# Patient Record
Sex: Male | Born: 1995 | Hispanic: No | Marital: Single | Smoking: Never smoker
Health system: Southern US, Community
[De-identification: ages and names within clinical notes are randomized; demographics above are authoritative.]

## PROBLEM LIST (undated history)

## (undated) DIAGNOSIS — Z9109 Other allergy status, other than to drugs and biological substances: Secondary | ICD-10-CM

---
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
## 2014-03-09 IMAGING — CR DG CHEST 2V
2 series · 2 of 2 positions shown · non-contrast
Comparison: None

CLINICAL DATA: Positive PPD

CHEST - 2 VIEW

[w chest pa]
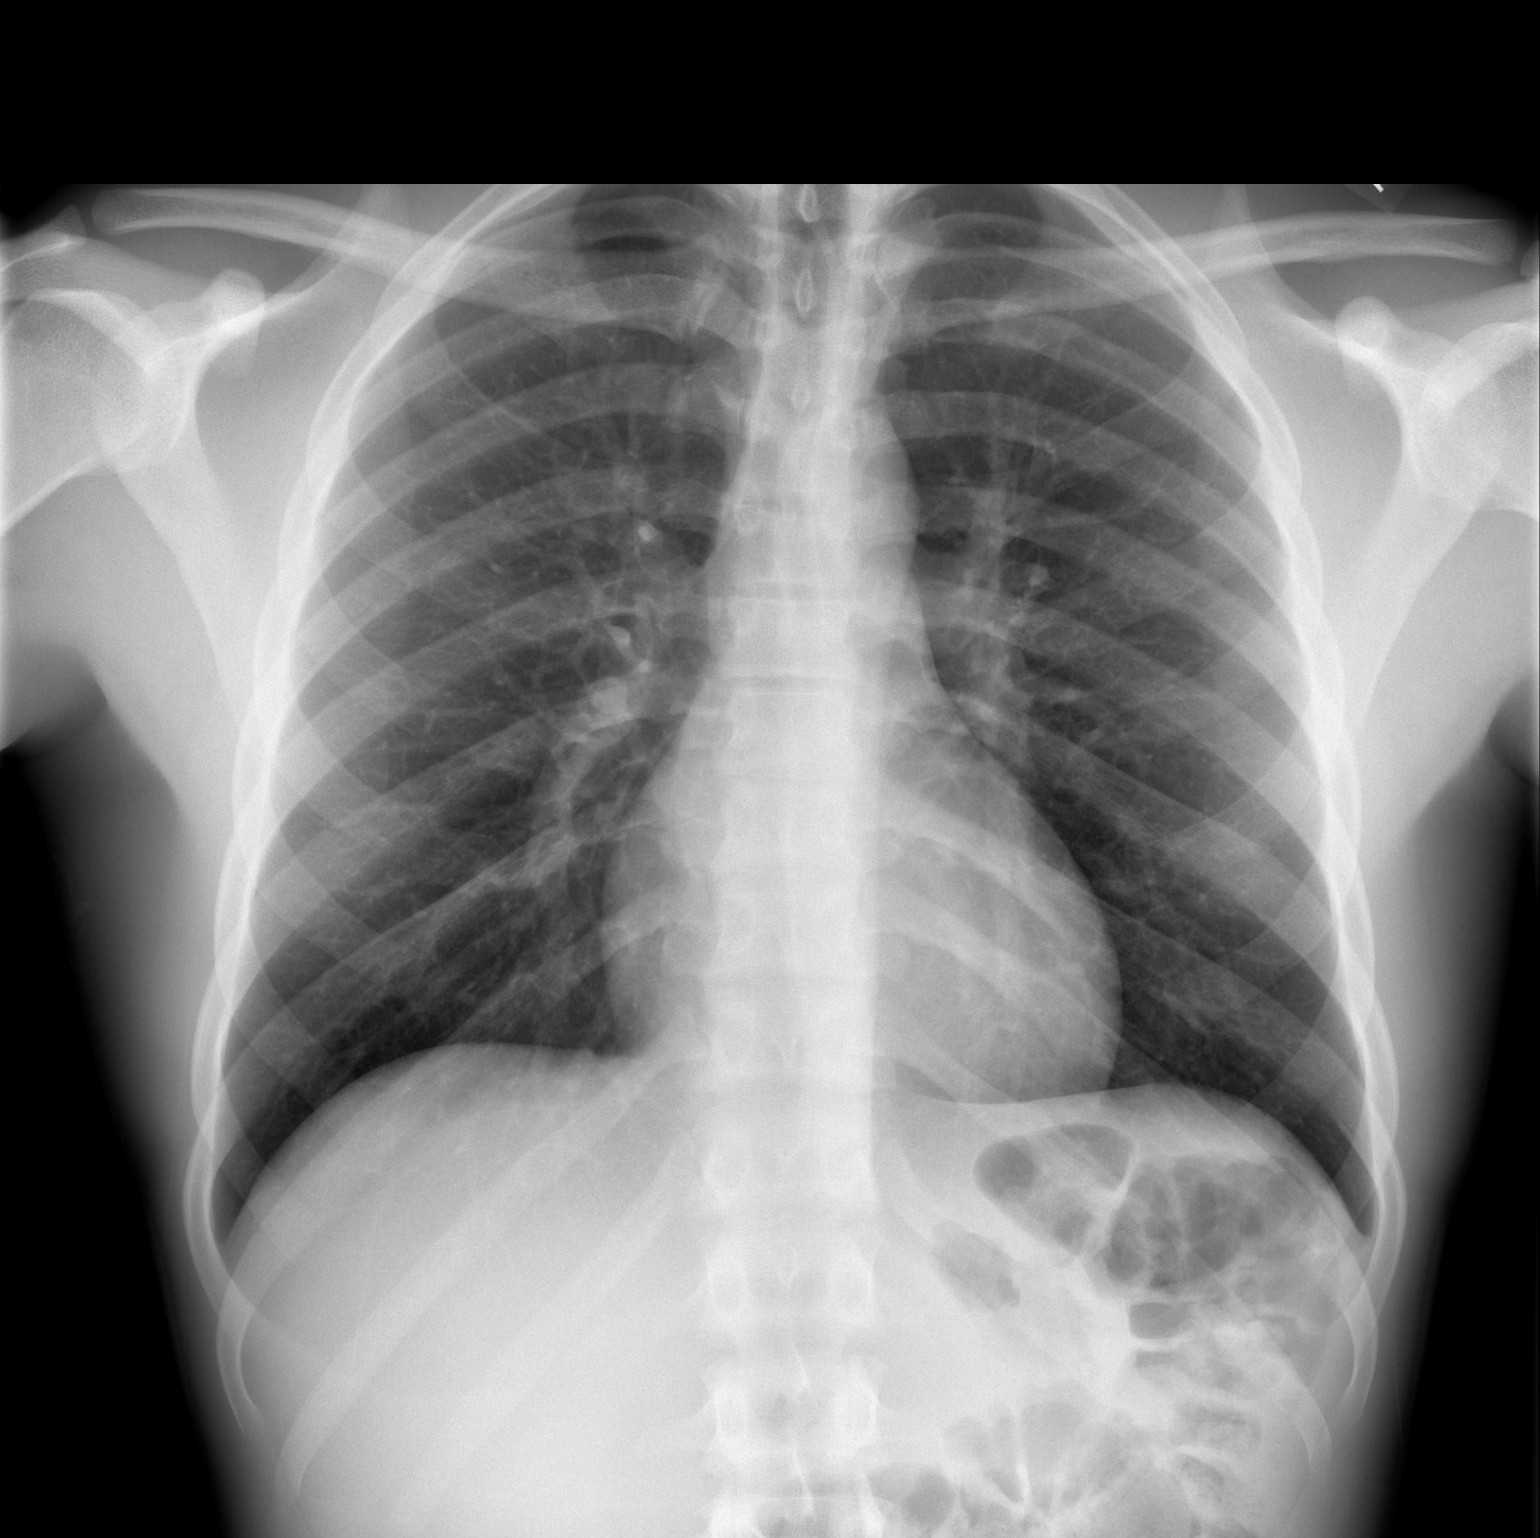

[w chest lat]
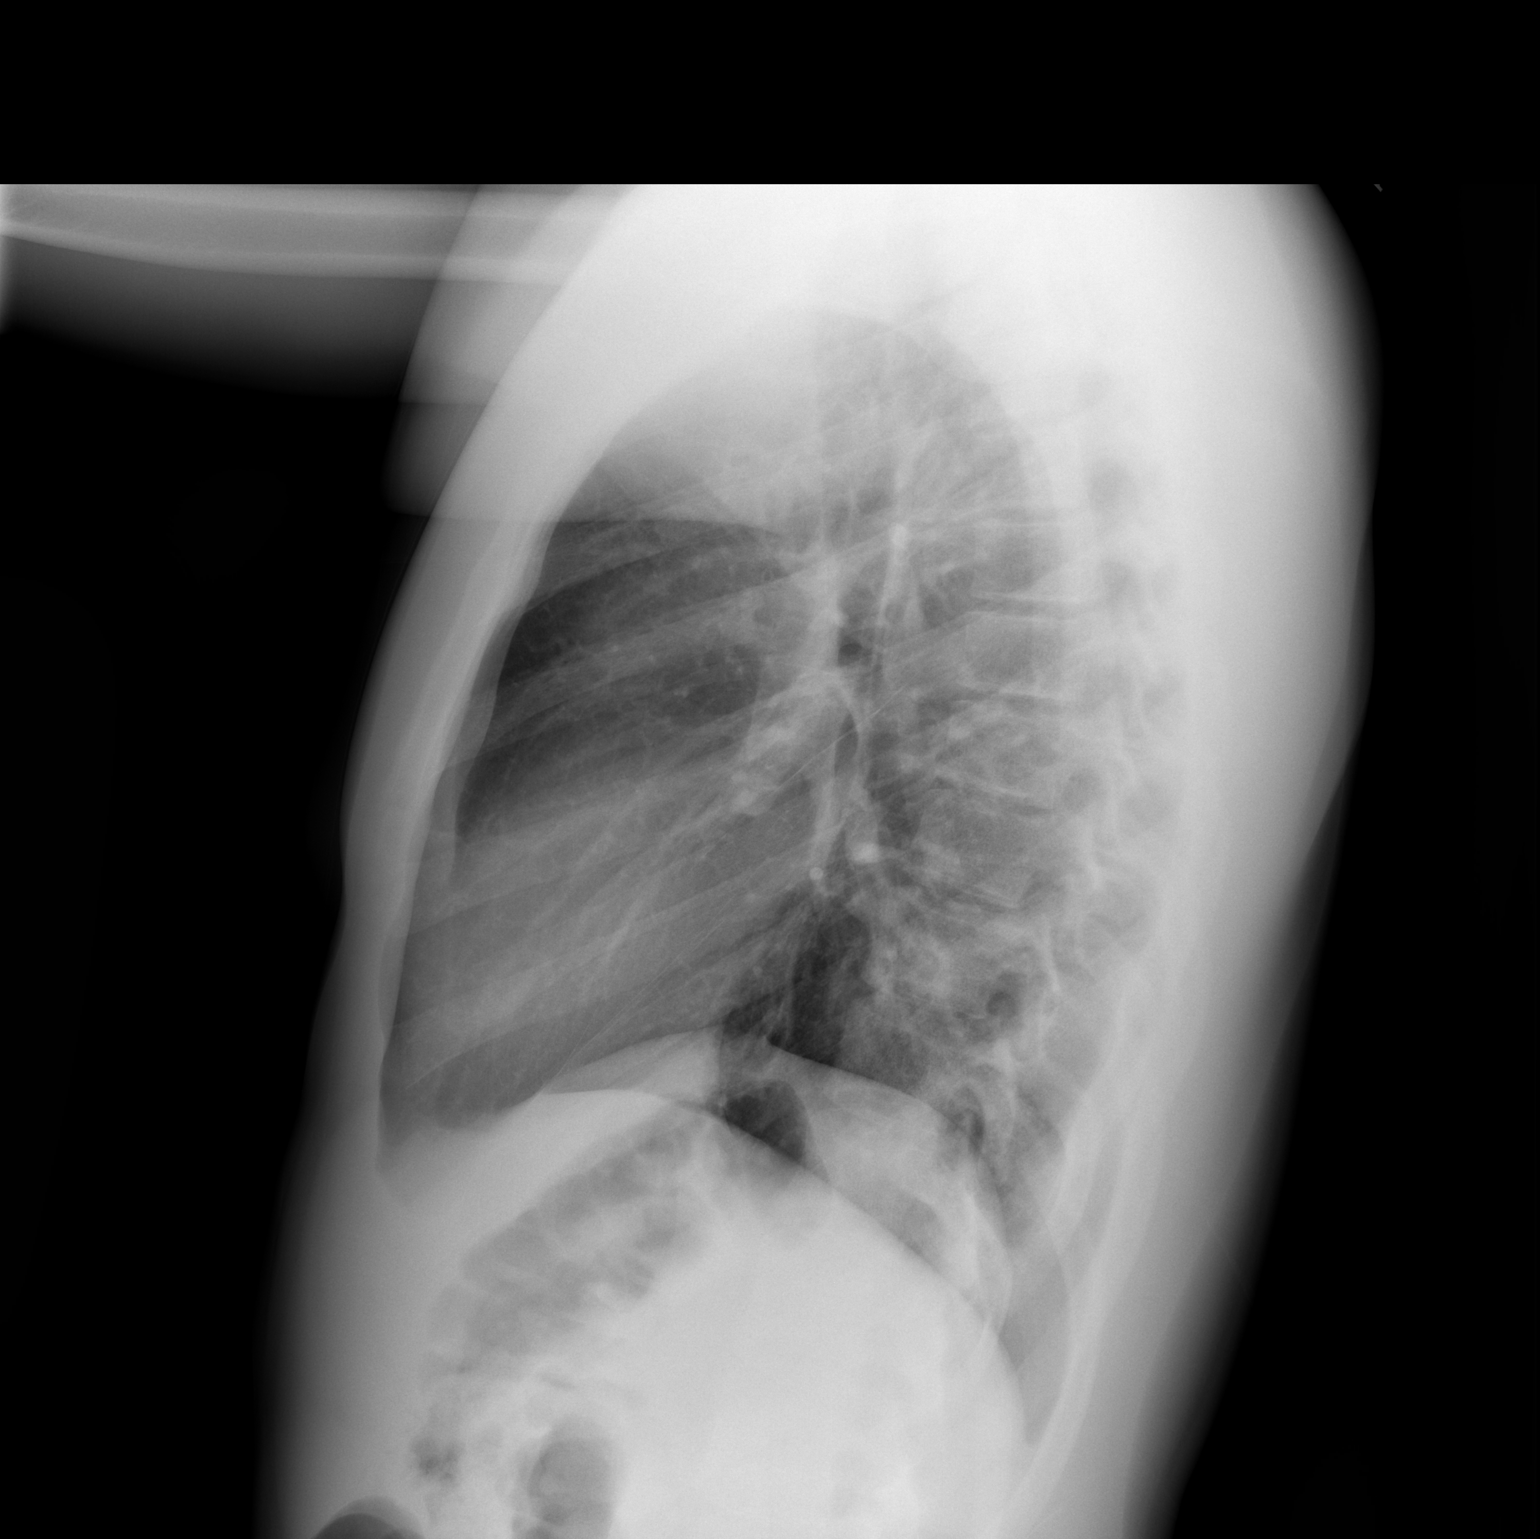

[2 of 2 positions shown; findings below may reference images not displayed]

FINDINGS: Lung volume is normal.  Lungs are clear without
infiltrate effusion or mass.  The heart size is normal and the
vascularity is normal. Negative for acute or chronic TB.
IMPRESSION: Negative

## 2018-12-21 ENCOUNTER — Emergency Department (HOSPITAL_COMMUNITY)
Admission: EM | Admit: 2018-12-21 | Discharge: 2018-12-21 | Disposition: A | Discharge status: Home / Self Care | Payer: BC Managed Care – PPO | Attending: Emergency Medicine | Admitting: Emergency Medicine

## 2018-12-21 ENCOUNTER — Other Ambulatory Visit: Payer: Self-pay

## 2018-12-21 ENCOUNTER — Encounter (HOSPITAL_COMMUNITY): Payer: Self-pay | Admitting: Emergency Medicine

## 2018-12-21 DIAGNOSIS — L0291 Cutaneous abscess, unspecified: Secondary | ICD-10-CM

## 2018-12-21 DIAGNOSIS — R519 Headache, unspecified: Secondary | ICD-10-CM | POA: Diagnosis present

## 2018-12-21 DIAGNOSIS — L02811 Cutaneous abscess of head [any part, except face]: Secondary | ICD-10-CM | POA: Insufficient documentation

## 2018-12-21 MED ORDER — ACETAMINOPHEN 500 MG PO TABS
1000.0000 mg | ORAL_TABLET | Freq: Once | ORAL | Status: AC
  Administered 2018-12-21: 1000 mg via ORAL
  Filled 2018-12-21: qty 2

## 2018-12-21 MED ORDER — BACITRACIN ZINC 500 UNIT/GM EX OINT
TOPICAL_OINTMENT | Freq: Two times a day (BID) | CUTANEOUS | Status: DC
  Administered 2018-12-21: 1 via TOPICAL
  Filled 2018-12-21: qty 0.9

## 2018-12-21 MED ORDER — LIDOCAINE-EPINEPHRINE 2 %-1:100000 IJ SOLN
20.0000 mL | Freq: Once | INTRAMUSCULAR | Status: AC
  Administered 2018-12-21: 10:00:00 20 mL via INTRADERMAL
  Filled 2018-12-21: qty 1

## 2018-12-21 MED ORDER — BUTALBITAL-APAP-CAFFEINE 50-325-40 MG PO TABS: ORAL_TABLET | ORAL | 0 refills | Status: AC

## 2018-12-21 NOTE — ED Triage Notes (Signed)
Pt c/o headaches that are intermittent over past month. For past 2-3 weeks noticed a mass on left side of head.

## 2018-12-21 NOTE — ED Provider Notes (Signed)
[facility] DEPT Provider Note   CSN: [id number] Arrival date & time: 12/21/18  0741     History   Chief Complaint Chief Complaint  Patient presents with  . Headache  . mass on head    HPI Andrew Holt is a 23 y.o. male.     HPI   Patient is a 23 year old male who presents emergency department today for evaluation of multiple complaints.  He is complaining of headaches and a swollen area to the left side of his head.  Headaches: Reports throbbing headaches located diffusely to his head.  They usually start gradually.  They have been ongoing for a month.  He does not currently have any symptoms.  States symptoms occur after studying for long hours at a time and after eating.  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  Last had his eyes checked 2 months ago and vision was normal.  Denies associated visual changes, vomiting, photosensitivity, numbness/weakness, dizziness, lightheadedness or other symptoms.  His symptoms unresolved with over-the-counter pain medications.  Denies history of same.  Does state that he grinds his teeth for multiple hours a day when he is awake and when sleeping.  Swollen area to head: He states that there has been a small area of swelling to the left side of his head.  Is been present for the last several weeks.  It is somewhat painful.  He thinks that it developed after wearing a mask for 1 of his lab classes.  He has had no fevers.  He has had minimal drainage from this area.  Past Medical History:  Diagnosis Date  . Environmental allergies     There are no active problems to display for this patient.   History reviewed. No pertinent surgical history.      Home Medications    Prior to Admission medications   Medication Sig Start Date End Date Taking? Authorizing Provider  butalbital-acetaminophen-caffeine (FIORICET) 50-325-40 MG tablet Take 1-2 tablets by mouth every 6 hours as needed for  headache. Do not take more than 6 tablets per day. 12/21/18   Sachi Boulay S, PA-C  HYDROcodone-acetaminophen (NORCO/VICODIN) 5-325 MG per tablet 1 to 2 tabs every 4 to 6 hours as needed for pain. Patient not taking: Reported on 12/21/2018 03/03/12   Harden Mo, MD    Family History No family history on file.  Social History Social History   Tobacco Use  . Smoking status: Never Smoker  . Smokeless tobacco: Never Used  Substance Use Topics  . Alcohol use: No  . Drug use: No     Allergies   Patient has no known allergies.   Review of Systems Review of Systems  Constitutional: Negative for fever.  HENT: Negative for ear pain and sore throat.   Eyes: Negative for photophobia and visual disturbance.  Respiratory: Negative for cough and shortness of breath.   Cardiovascular: Negative for chest pain.  Gastrointestinal: Negative for abdominal pain, nausea and vomiting.  Genitourinary: Negative for hematuria.  Musculoskeletal: Negative for back pain.  Skin:       abscess  Neurological: Positive for headaches (resolved). Negative for dizziness, syncope, weakness, light-headedness and numbness.  All other systems reviewed and are negative.    Physical Exam Updated Vital Signs BP 124/65 (BP Location: Left Arm)   Pulse (!) 52   Temp 98.5 F (36.9 C) (Oral)   Resp 18   SpO2 100%   Physical Exam Vitals signs and  nursing note reviewed.  Constitutional:      Appearance: He is well-developed.  HENT:     Head: Normocephalic and atraumatic.     Comments: 1.5 cm area of fluctuance to the left side of the head without surrounding induration or erythema Eyes:     Conjunctiva/sclera: Conjunctivae normal.  Neck:     Musculoskeletal: Neck supple.  Cardiovascular:     Rate and Rhythm: Normal rate and regular rhythm.     Heart sounds: Normal heart sounds. No murmur.  Pulmonary:     Effort: Pulmonary effort is normal. No respiratory distress.     Breath sounds: Normal  breath sounds. No wheezing, rhonchi or rales.  Abdominal:     General: Bowel sounds are normal.     Palpations: Abdomen is soft.     Tenderness: There is no abdominal tenderness.  Skin:    General: Skin is warm and dry.  Neurological:     Mental Status: He is alert.     Comments: Mental Status:  Alert, thought content appropriate, able to give a coherent history. Speech fluent without evidence of aphasia. Able to follow 2 step commands without difficulty.  Cranial Nerves:  II:  Pupils equal, round, reactive to light III,IV, VI: ptosis not present, extra-ocular motions intact bilaterally  V,VII: smile symmetric, facial light touch sensation equal VIII: hearing grossly normal to voice  X: uvula elevates symmetrically  XI: bilateral shoulder shrug symmetric and strong XII: midline tongue extension without fassiculations Motor:  Normal tone. 5/5 strength of BUE and BLE major muscle groups including strong and equal grip strength and dorsiflexion/plantar flexion Sensory: light touch normal in all extremities. Cerebellar: normal finger-to-nose with bilateral upper extremities      ED Treatments / Results  Labs (all labs ordered are listed, but only abnormal results are displayed) Labs Reviewed - No data to display  EKG None  Radiology No results found.  Procedures Procedures (including critical care time)  Medications Ordered in ED Medications  lidocaine-EPINEPHrine (XYLOCAINE W/EPI) 2 %-1:100000 (with pres) injection 20 mL (has no administration in time range)  bacitracin ointment (has no administration in time range)  acetaminophen (TYLENOL) tablet 1,000 mg (1,000 mg Oral Given 12/21/18 0930)     Initial Impression / Assessment and Plan / ED Course  I have reviewed the triage vital signs and the nursing notes.  Pertinent labs & imaging results that were available during my care of the patient were reviewed by me and considered in my medical decision making (see chart  for details).    Final Clinical Impressions(s) / ED Diagnoses   Final diagnoses:  Abscess  Nonintractable headache, unspecified chronicity pattern, unspecified headache type   Patient is a 23 year old male who presents emergency department today for evaluation of multiple complaints.  He is complaining of headaches and a swollen area to the left side of his head.  Headaches: Gradual onset headaches occurring intermittently for months.  No headache today.  No associated neuro complaints.  Neuro exam normal today. Pt HA treated and improved while in ED.  Presentation non concerning for [facility], ICH, Meningitis, or temporal arteritis. Pt is afebrile with no focal neuro deficits, nuchal rigidity, or change in vision.  I suspect his symptoms are related to tension type headache and that the grinding of his teeth may be exacerbating his symptoms.  Will give Rx for Fioricet.  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  Advised on specific  return precautions.    Swollen area to head: Patient with small abscess to the left side of the head.  This was incised and drained with a small amount of purulent drainage.  No antibiotics indicated.   Patient has advised to follow-up as directed and return to the ED for new or worsening symptoms.  Voiced understanding of plan and reasons to return.  All questions answered.  Patient safe for discharge.  ED Discharge Orders         Ordered    butalbital-acetaminophen-caffeine (FIORICET) 50-325-40 MG tablet     12/21/18 1024           Karrie MeresCouture, Bryce Cheever S, [place]PA-C 12/21/18 1032    Lorre NickAllen, Anthony, MD 12/21/18 1406

## 2018-12-21 NOTE — Discharge Instructions (Signed)
Please follow up with your primary care provider within 5-7 days for re-evaluation of your symptoms. If you do not have a primary care provider, information for a healthcare clinic has been provided for you to make arrangements for follow up care. Please return to the emergency department for any new or worsening symptoms. ° °
